# Patient Record
Sex: Male | Born: 1995 | Race: White | Hispanic: No | Marital: Single | Smoking: Current every day smoker
Health system: Southern US, Community
[De-identification: ages and names within clinical notes are randomized; demographics above are authoritative.]

---

## 2018-02-03 DIAGNOSIS — Y999 Unspecified external cause status: Secondary | ICD-10-CM | POA: Insufficient documentation

## 2018-02-03 DIAGNOSIS — Y9389 Activity, other specified: Secondary | ICD-10-CM | POA: Insufficient documentation

## 2018-02-03 DIAGNOSIS — M79601 Pain in right arm: Secondary | ICD-10-CM | POA: Insufficient documentation

## 2018-02-03 DIAGNOSIS — R51 Headache: Secondary | ICD-10-CM | POA: Insufficient documentation

## 2018-02-03 DIAGNOSIS — Y9239 Other specified sports and athletic area as the place of occurrence of the external cause: Secondary | ICD-10-CM | POA: Insufficient documentation

## 2018-02-03 DIAGNOSIS — F17210 Nicotine dependence, cigarettes, uncomplicated: Secondary | ICD-10-CM | POA: Insufficient documentation

## 2018-02-04 ENCOUNTER — Other Ambulatory Visit: Payer: Self-pay

## 2018-02-04 ENCOUNTER — Emergency Department (HOSPITAL_COMMUNITY)
Admission: EM | Admit: 2018-02-04 | Discharge: 2018-02-04 | Disposition: A | Discharge status: Home / Self Care | Payer: Self-pay | Attending: Emergency Medicine | Admitting: Emergency Medicine

## 2018-02-04 ENCOUNTER — Emergency Department (HOSPITAL_COMMUNITY): Payer: Self-pay

## 2018-02-04 ENCOUNTER — Encounter (HOSPITAL_COMMUNITY): Payer: Self-pay

## 2018-02-04 DIAGNOSIS — M79601 Pain in right arm: Secondary | ICD-10-CM

## 2018-02-04 MED ORDER — METHOCARBAMOL 750 MG PO TABS: 750.0000 mg | ORAL_TABLET | Freq: Four times a day (QID) | ORAL | 0 refills | Status: AC

## 2018-02-04 MED ORDER — IBUPROFEN 200 MG PO TABS
600.0000 mg | ORAL_TABLET | Freq: Once | ORAL | Status: AC
  Administered 2018-02-04: 600 mg via ORAL
  Filled 2018-02-04: qty 3

## 2018-02-04 MED ORDER — IBUPROFEN 600 MG PO TABS: 600.0000 mg | ORAL_TABLET | Freq: Four times a day (QID) | ORAL | 0 refills | Status: AC | PRN

## 2018-02-04 NOTE — ED Triage Notes (Signed)
Thrown from bull and now right forearm pain happened about 2130 pm last night and head pain also voiced clear speech noted.

## 2018-02-04 NOTE — ED Provider Notes (Signed)
[facility]-EMERGENCY DEPT Provider Note   CSN: [id number] Arrival date & time: 02/03/18  2347     History   Chief Complaint Chief Complaint  Patient presents with  . Arm Pain    HPI Jeremy Zavala is a 22 y.o. male.  22 year old male who fell off a bull and complains of pain to his right arm.  Denied any head or neck injury.  Pain is sharp and is the entire arm.  Notes he has full range of motion at his right shoulder.  Has pain to his humerus and forearm as well as wrist and hand.  Denies any distal numbness or tingling.  Pain better with remaining still and worse with movement.  No treatment used prior to arrival      History reviewed. No pertinent past medical history.  There are no active problems to display for this patient.   History reviewed. No pertinent surgical history.     Home Medications    Prior to Admission medications   Not on File    Family History History reviewed. No pertinent family history.  Social History Social History   Tobacco Use  . Smoking status: Current Every Day Smoker  . Smokeless tobacco: Current User  Substance Use Topics  . Alcohol use: Yes    Frequency: Never  . Drug use: No     Allergies   Zithromax [azithromycin]   Review of Systems Review of Systems  All other systems reviewed and are negative.    Physical Exam Updated Vital Signs BP (!) 147/83 (BP Location: Left Arm)   Pulse 91   Temp 98 F (36.7 C) (Oral)   Resp 18   Ht 1.753 m (5\' 9" )   Wt 77.1 kg (170 lb)   SpO2 97%   BMI 25.10 kg/m   Physical Exam  Constitutional: He is oriented to person, place, and time. He appears well-developed and well-nourished.  Non-toxic appearance. No distress.  HENT:  Head: Normocephalic and atraumatic.  Eyes: Conjunctivae, EOM and lids are normal. Pupils are equal, round, and reactive to light.  Neck: Normal range of motion. Neck supple. No tracheal deviation present. No thyroid mass  present.  Cardiovascular: Normal rate, regular rhythm and normal heart sounds. Exam reveals no gallop.  No murmur heard. Pulmonary/Chest: Effort normal and breath sounds normal. No stridor. No respiratory distress. He has no decreased breath sounds. He has no wheezes. He has no rhonchi. He has no rales.  Abdominal: Soft. Normal appearance and bowel sounds are normal. He exhibits no distension. There is no tenderness. There is no rebound and no CVA tenderness.  Musculoskeletal: Normal range of motion. He exhibits no edema or tenderness.       Arms: Full range of motion at right shoulder.  Right elbow also with full range of motion slightly limited by pain.  Right wrist also with full range of motion.  No deformities or bruising noted.  Radial pulses 2+.  Neurovascular intact at the digits  Neurological: He is alert and oriented to person, place, and time. He has normal strength. No cranial nerve deficit or sensory deficit. GCS eye subscore is 4. GCS verbal subscore is 5. GCS motor subscore is 6.  Skin: Skin is warm and dry. No abrasion and no rash noted.  Psychiatric: He has a normal mood and affect. His speech is normal and behavior is normal.  Nursing note and vitals reviewed.    ED Treatments / Results  Labs (all labs ordered are  listed, but only abnormal results are displayed) Labs Reviewed - No data to display  EKG  EKG Interpretation None       Radiology Dg Forearm Right  Result Date: 02/04/2018 CLINICAL DATA:  Thrown from bull. Acute onset of right forearm pain. Initial encounter. EXAM: RIGHT FOREARM - 2 VIEW COMPARISON:  None. FINDINGS: There is no evidence of fracture or dislocation. The radius and ulna appear intact. Mild negative ulnar variance is noted. The carpal rows appear grossly intact, and demonstrate normal alignment. The elbow joint is unremarkable in appearance. IMPRESSION: No evidence of fracture or dislocation. Electronically Signed   By: Roanna Raider M.D.   On:  02/04/2018 00:53    Procedures Procedures (including critical care time)  Medications Ordered in ED Medications - No data to display   Initial Impression / Assessment and Plan / ED Course  I have reviewed the triage vital signs and the nursing notes.  Pertinent labs & imaging results that were available during my care of the patient were reviewed by me and considered in my medical decision making (see chart for details).   She is x-rays are negative.  Will be given a sling for comfort.  Final Clinical Impressions(s) / ED Diagnoses   Final diagnoses:  None    ED Discharge Orders    None       Lorre Nick, MD 02/04/18 [phone]

## 2018-06-30 IMAGING — CR DG ELBOW COMPLETE 3+V*R*
4 series · 4 of 4 positions shown · non-contrast
Comparison: None.

CLINICAL DATA: Status post fall from bull, with right elbow pain.
Initial encounter.

EXAM:
RIGHT ELBOW - COMPLETE 3+ VIEW

[x elbow ap right]
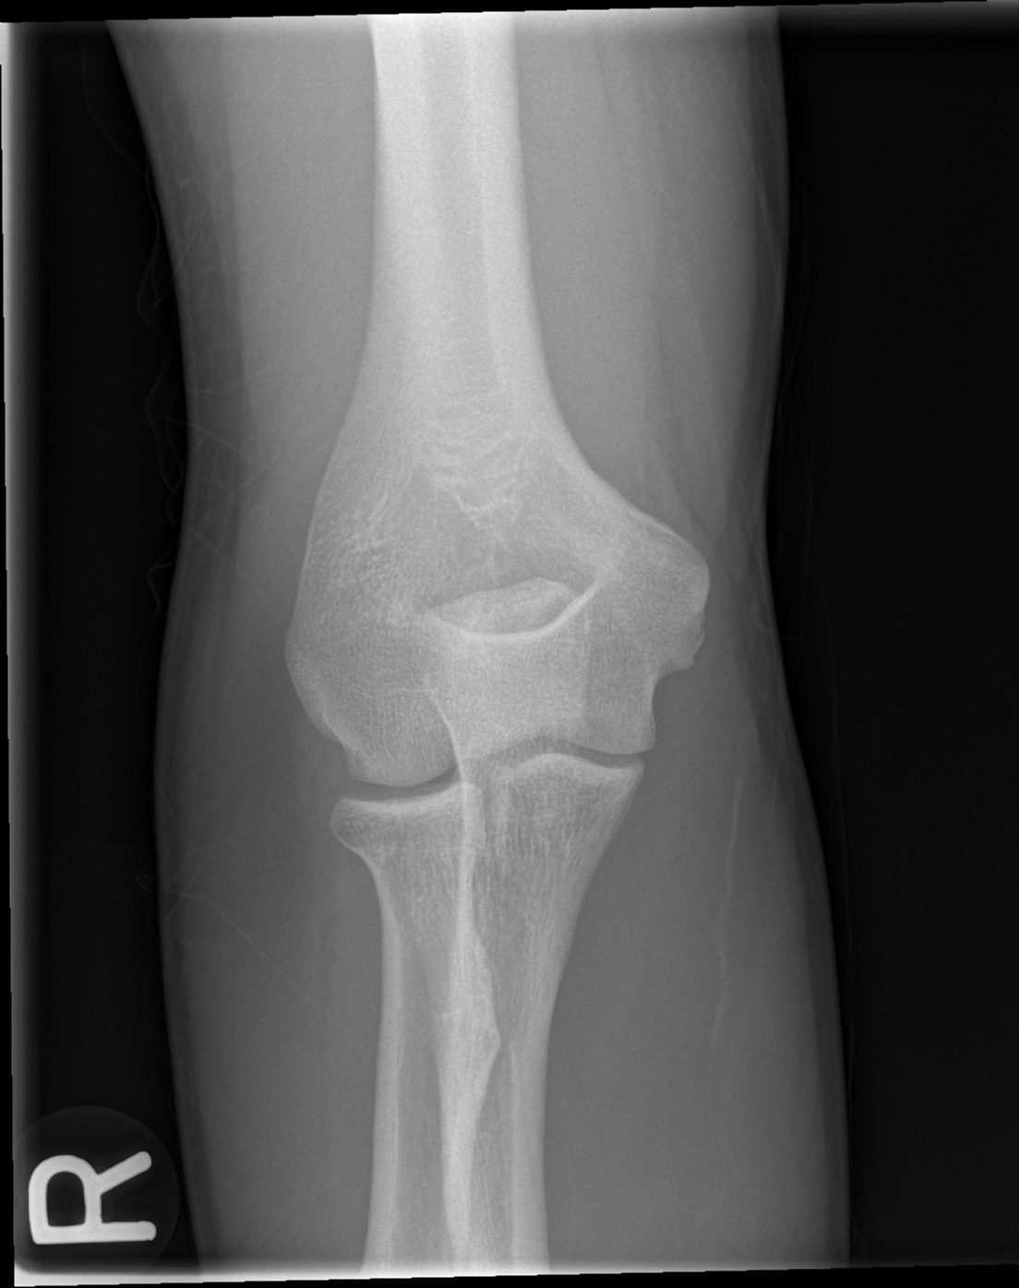

[x elbow obl right (1 of 2)]
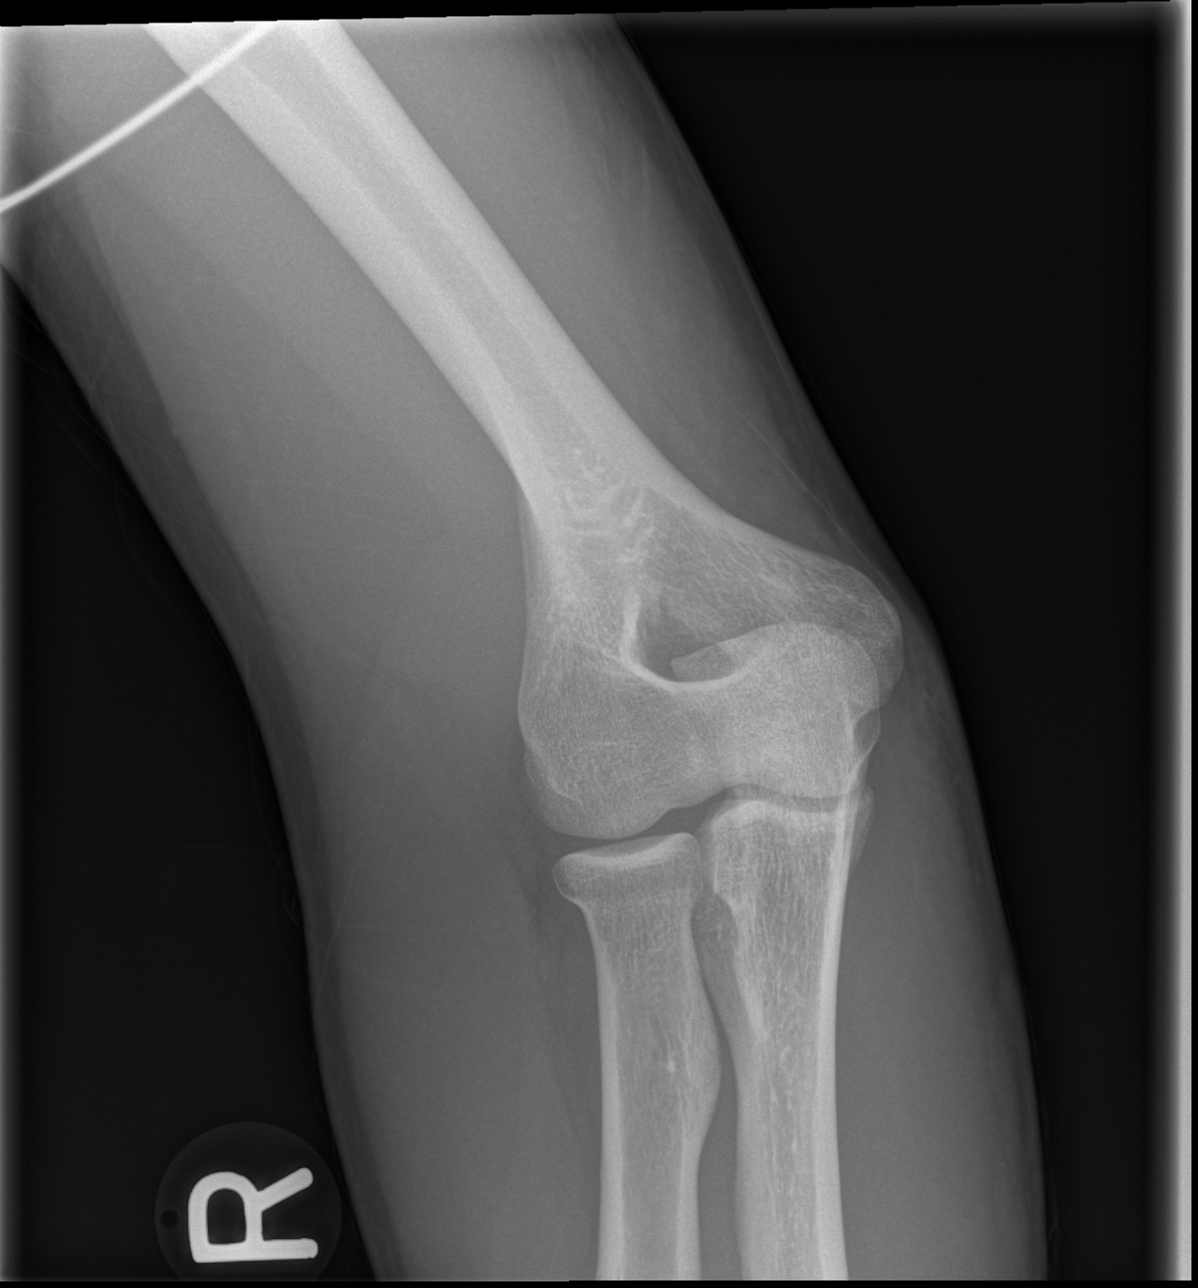

[x elbow obl right (2 of 2)]
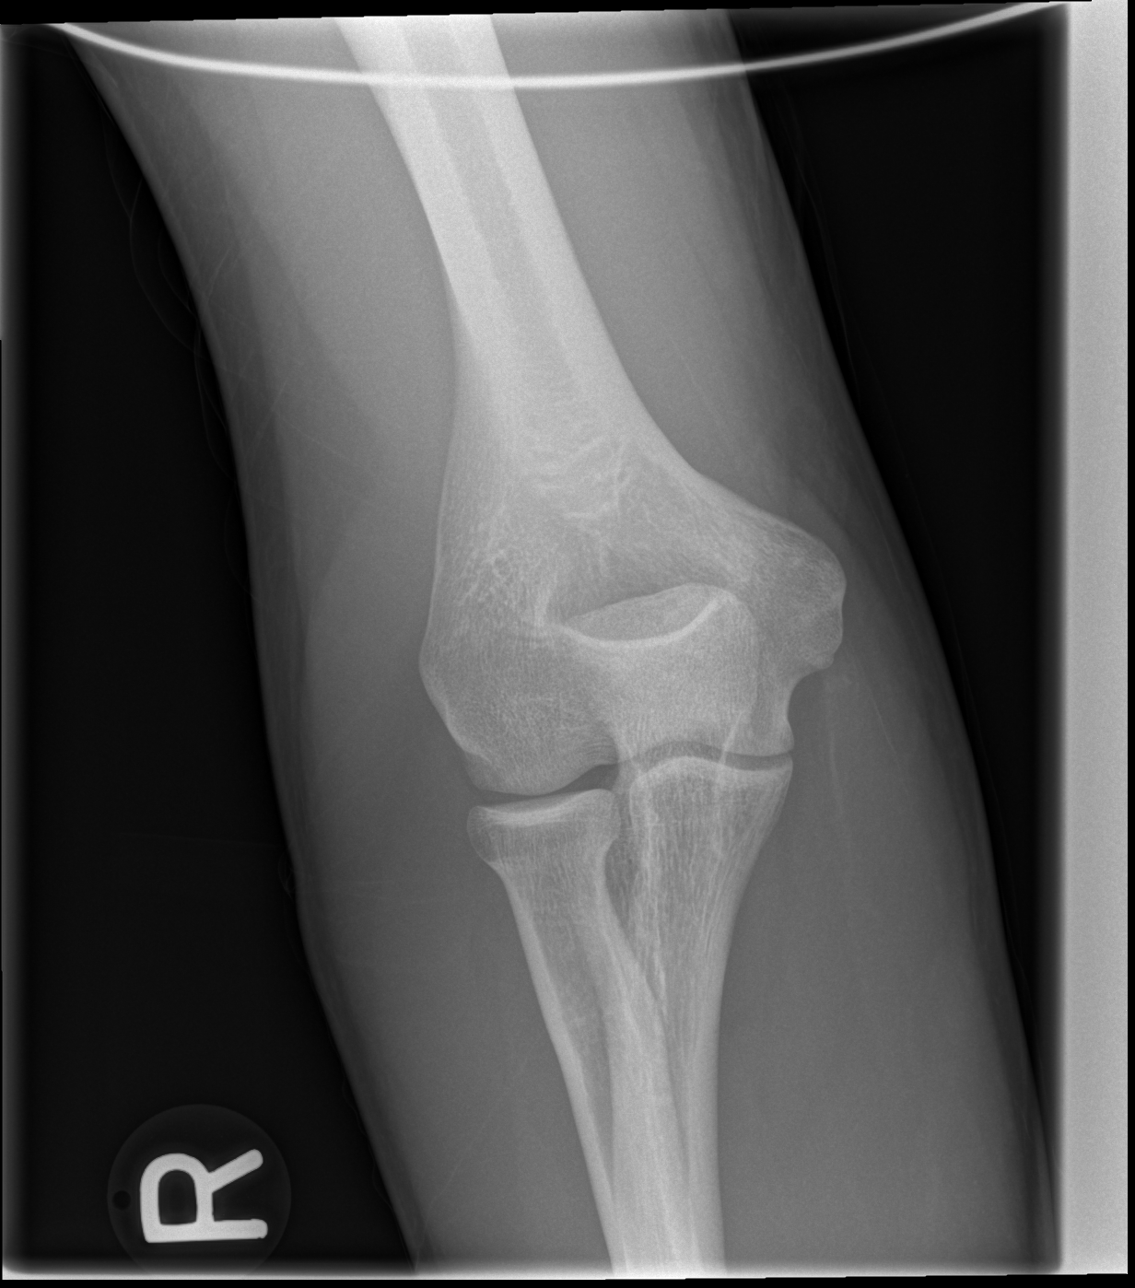

[x elbow lat right]
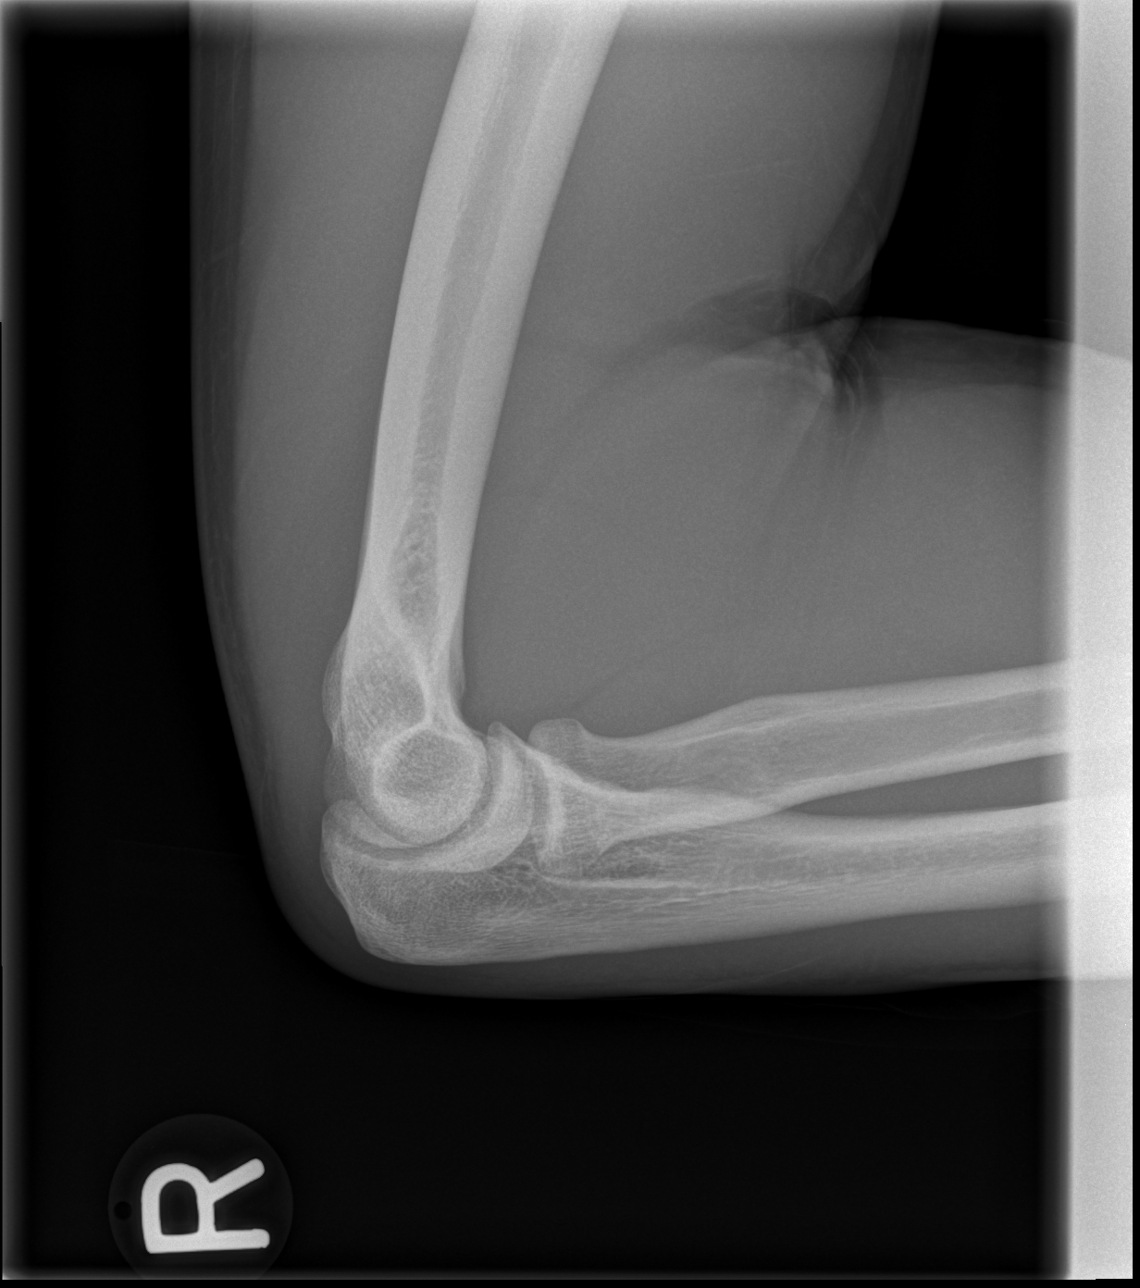

[4 of 4 positions shown; findings below may reference images not displayed]

FINDINGS: There is no evidence of fracture or dislocation. The visualized
joint spaces are preserved. No significant joint effusion is
identified. The soft tissues are unremarkable in appearance.
IMPRESSION: No evidence of fracture or dislocation.
